# Patient Record
Sex: Female | Born: 1970 | Race: White | Hispanic: No | Marital: Married | Smoking: Never smoker
Health system: Southern US, Community
[De-identification: ages and names within clinical notes are randomized; demographics above are authoritative.]

## PROBLEM LIST (undated history)

## (undated) DIAGNOSIS — N809 Endometriosis, unspecified: Secondary | ICD-10-CM

## (undated) HISTORY — PX: AUGMENTATION MAMMAPLASTY: SUR837

## (undated) HISTORY — PX: CARPAL TUNNEL RELEASE: SHX101

## (undated) HISTORY — PX: DIAGNOSTIC LAPAROSCOPY: SUR761

## (undated) HISTORY — DX: Endometriosis, unspecified: N80.9

## (undated) HISTORY — PX: PLACEMENT OF BREAST IMPLANTS: SHX6334

---

## 1998-09-02 ENCOUNTER — Other Ambulatory Visit: Admission: RE | Admit: 1998-09-02 | Discharge: 1998-09-02 | Payer: Self-pay | Admitting: Obstetrics and Gynecology

## 1999-06-23 ENCOUNTER — Other Ambulatory Visit: Admission: RE | Admit: 1999-06-23 | Discharge: 1999-06-23 | Payer: Self-pay | Admitting: Obstetrics and Gynecology

## 2000-02-29 ENCOUNTER — Inpatient Hospital Stay (HOSPITAL_COMMUNITY): Admission: AD | Admit: 2000-02-29 | Discharge: 2000-03-02 | Payer: Self-pay | Admitting: Obstetrics and Gynecology

## 2000-04-05 ENCOUNTER — Other Ambulatory Visit: Admission: RE | Admit: 2000-04-05 | Discharge: 2000-04-05 | Payer: Self-pay | Admitting: Obstetrics and Gynecology

## 2001-01-03 ENCOUNTER — Other Ambulatory Visit: Admission: RE | Admit: 2001-01-03 | Discharge: 2001-01-03 | Payer: Self-pay | Admitting: Obstetrics and Gynecology

## 2002-11-07 ENCOUNTER — Other Ambulatory Visit: Admission: RE | Admit: 2002-11-07 | Discharge: 2002-11-07 | Payer: Self-pay | Admitting: Obstetrics and Gynecology

## 2003-11-01 ENCOUNTER — Encounter: Admission: RE | Admit: 2003-11-01 | Discharge: 2003-11-01 | Payer: Self-pay | Admitting: Internal Medicine

## 2004-01-03 ENCOUNTER — Ambulatory Visit (HOSPITAL_COMMUNITY): Admission: RE | Admit: 2004-01-03 | Discharge: 2004-01-03 | Payer: Self-pay | Admitting: Obstetrics and Gynecology

## 2004-05-27 ENCOUNTER — Other Ambulatory Visit: Admission: RE | Admit: 2004-05-27 | Discharge: 2004-05-27 | Payer: Self-pay | Admitting: Obstetrics and Gynecology

## 2005-01-26 ENCOUNTER — Ambulatory Visit: Payer: Self-pay | Admitting: Internal Medicine

## 2007-05-28 ENCOUNTER — Ambulatory Visit: Payer: Self-pay | Admitting: Internal Medicine

## 2011-01-02 ENCOUNTER — Emergency Department: Payer: Self-pay | Admitting: *Deleted

## 2011-06-05 ENCOUNTER — Ambulatory Visit: Payer: Self-pay

## 2012-04-13 ENCOUNTER — Ambulatory Visit: Payer: Self-pay | Admitting: Nurse Practitioner

## 2013-06-19 ENCOUNTER — Telehealth: Payer: Self-pay | Admitting: Internal Medicine

## 2013-06-19 NOTE — Telephone Encounter (Signed)
Patient states she saw her GYN for severe rectal pain only during her period.  GYN took her off BCP. She has had a vaginal [place] and "hormone test" both were normal.She states she has been diagnosed with endometriosis in the past. She also has a family history of colon cancer. Scheduled with Dr. Juanda Chance on 06/23/13 at 3:00 PM.

## 2013-06-23 ENCOUNTER — Ambulatory Visit (INDEPENDENT_AMBULATORY_CARE_PROVIDER_SITE_OTHER): Payer: Managed Care, Other (non HMO) | Admitting: Internal Medicine

## 2013-06-23 ENCOUNTER — Encounter: Payer: Self-pay | Admitting: *Deleted

## 2013-06-23 VITALS — BP 112/70 | HR 80 | Ht 60.0 in | Wt 121.4 lb

## 2013-06-23 DIAGNOSIS — Z8742 Personal history of other diseases of the female genital tract: Secondary | ICD-10-CM

## 2013-06-23 DIAGNOSIS — K6289 Other specified diseases of anus and rectum: Secondary | ICD-10-CM

## 2013-06-23 NOTE — Progress Notes (Signed)
Cynthia Lowe 12/27/1970 [id number]  Note: This dictation was prepared with Dragon digital system. Any transcriptional errors that result from this procedure are unintentional.   History of Present Illness:  This is a 43 year old white female who had an episode of severe rectal pressure and pain. Several weeks ago, she was evaluated in the emergency room. Secondly, she developed pain when sitting. She is currently asymptomatic. There is a history of severe endometriosis necessitating 2 laparoscopic ablations by Dr. Arelia Sneddon in 2005 and 2006. She, at that time, was having similar rectal pressure like at present  But only during her periods. She was offered a total abdominal hysterectomy and bilateral salpingo-oophorectomy but decided not to pursue it. Instead, she was placed on birth control pills which were very effective for several years   until October 2014 when she started to develop recurrent rectal pressure and pain. She denies abdominal pain. Her bowel habits are regular. There are no obstructive symptoms. She is having some night sweats and headaches since the birth control pills were stopped about a month ago and Mirena 20 mg IUD was placed by Dr Arelia Sneddon. She is anticipating her menstrual cycle to start any day and she fears that the pain will come back. Patient had a colonoscopy in August 2005 to look for endometriosis in the rectum after an abnormal CT scan showed findings which indicated recurrent endometriosis in rectal tissue as well as around the ureters. The colonoscopy was entirely normal.    Past Medical History  Diagnosis Date  . Endometriosis     Past Surgical History  Procedure Laterality Date  . Diagnostic laparoscopy      Allergies  Allergen Reactions  . Norco [Hydrocodone-Acetaminophen]     Family history and social history have been reviewed.  Review of Systems:   The remainder of the 10 point ROS is negative except as outlined in the H&P  Physical  Exam: General Appearance Well developed, in no distress Eyes  Non icteric  HEENT  Non traumatic, normocephalic  Mouth No lesion, tongue papillated, no cheilosis Neck Supple without adenopathy, thyroid not enlarged, no carotid bruits, no JVD Lungs Clear to auscultation bilaterally COR Normal S1, normal S2, regular rhythm, no murmur, quiet precordium Abdomen soft nontender with normoactive bowel sounds. Liver edge at costal margin. There is minimal discomfort on deep impression left lower quadrant. Suprapubic area is normal at lower quadrant is unremarkable Rectal and anoscopic exam reveals normal rectal sphincter tone. Normal perianal area. Normal rectal ampulla with no nodularity, edema or inflammatory changes. There are very small internal hemorrhoids which don't appear to be inflamed or painful. Stool is formed Hemoccult-negative Extremities  No pedal edema Skin No lesions Neurological Alert and oriented x 3 Psychological Normal mood and affect  Assessment and Plan:   Problem #1 known endometriosis fully evaluated and treated on 09/03/2004 by Dr Arelia Sneddon. She is now starting to have a recurrence of similar symptoms. A prior CT scan of the pelvis in 2005 suggested irregularity of the anterior rectal wall as well as indistinctness of the parametrial soft tissue. I believe we are dealing with recurrence of the disease around the rectal area causing spasm. She is not having any obstructive symptoms at this point. We will proceed with a repeat CT scan of the pelvis with intravenous and oral contrast. If there is suggestion of any constriction of the rectosigmoid, she may need a colonoscopy to further identify the extent of the disease. She will be following up with Dr. Arelia Sneddon after  our evaluation.  Problem #2 Small internal hemorrhoids. These are currently not symptomatic.    Lina SarDora Alyssa Rotondo 06/23/2013

## 2013-06-23 NOTE — Patient Instructions (Signed)
You have been scheduled for a CT scan of the abdomen and pelvis at [facility] ([address]---this is in the same building as Press [facility]).   You are scheduled on 06/27/13 at 2:30 pm. You should arrive 15 minutes prior to your appointment time for registration. Please follow the written instructions below on the day of your exam:  WARNING: IF YOU ARE ALLERGIC TO IODINE/X-RAY DYE, PLEASE NOTIFY RADIOLOGY IMMEDIATELY AT [phone]! YOU WILL BE GIVEN A 13 HOUR PREMEDICATION PREP.  1) Do not eat or drink anything after 10:30 am (4 hours prior to your test) 2) You have been given 2 bottles of oral contrast to drink. The solution may taste better if refrigerated, but do NOT add ice or any other liquid to this solution. Shake well before drinking.    Drink 1 bottle of contrast @ 12:30 pm (2 hours prior to your exam)  Drink 1 bottle of contrast @ 1:30 pm (1 hour prior to your exam)  You may take any medications as prescribed with a small amount of water except for the following: Metformin, Glucophage, Glucovance, Avandamet, Riomet, Fortamet, Actoplus Met, Janumet, Glumetza or Metaglip. The above medications must be held the day of the exam AND 48 hours after the exam.  The purpose of you drinking the oral contrast is to aid in the visualization of your intestinal tract. The contrast solution may cause some diarrhea. Before your exam is started, you will be given a small amount of fluid to drink. Depending on your individual set of symptoms, you may also receive an intravenous injection of x-ray contrast/dye. Plan on being at [facility] for 30 minutes or long, depending on the type of exam you are having performed.  This test typically takes 30-45 minutes to complete.  If you have any questions regarding your exam or if you need to reschedule, you may call the CT department at [phone] between the hours of 8:00 am and 5:00 pm,  Monday-Friday.  ________________________________________________________________________  CC:Dr Mccomb

## 2013-06-27 ENCOUNTER — Ambulatory Visit (INDEPENDENT_AMBULATORY_CARE_PROVIDER_SITE_OTHER)
Admission: RE | Admit: 2013-06-27 | Discharge: 2013-06-27 | Disposition: A | Discharge status: Home / Self Care | Payer: Managed Care, Other (non HMO) | Source: Ambulatory Visit | Attending: Internal Medicine | Admitting: Internal Medicine

## 2013-06-27 DIAGNOSIS — K6289 Other specified diseases of anus and rectum: Secondary | ICD-10-CM

## 2013-06-27 DIAGNOSIS — Z8742 Personal history of other diseases of the female genital tract: Secondary | ICD-10-CM

## 2013-06-27 MED ORDER — IOHEXOL 300 MG/ML  SOLN
100.0000 mL | Freq: Once | INTRAMUSCULAR | Status: AC | PRN
  Administered 2013-06-27: 100 mL via INTRAVENOUS

## 2013-06-28 ENCOUNTER — Other Ambulatory Visit: Payer: Self-pay | Admitting: *Deleted

## 2013-06-28 DIAGNOSIS — K6289 Other specified diseases of anus and rectum: Secondary | ICD-10-CM

## 2013-06-29 ENCOUNTER — Encounter: Payer: Self-pay | Admitting: Internal Medicine

## 2013-07-10 ENCOUNTER — Ambulatory Visit (AMBULATORY_SURGERY_CENTER): Payer: Managed Care, Other (non HMO) | Admitting: *Deleted

## 2013-07-10 VITALS — Ht 60.0 in | Wt 121.0 lb

## 2013-07-10 DIAGNOSIS — K6289 Other specified diseases of anus and rectum: Secondary | ICD-10-CM

## 2013-07-10 MED ORDER — MOVIPREP 100 G PO SOLR: ORAL | Status: DC

## 2013-07-10 NOTE — Progress Notes (Signed)
Patient denies any allergies to eggs or soy. Patient has nausea after anesthesia.  

## 2013-07-21 ENCOUNTER — Encounter: Payer: Self-pay | Admitting: Internal Medicine

## 2013-07-25 ENCOUNTER — Ambulatory Visit (AMBULATORY_SURGERY_CENTER): Payer: Managed Care, Other (non HMO) | Admitting: Internal Medicine

## 2013-07-25 ENCOUNTER — Encounter: Payer: Self-pay | Admitting: Internal Medicine

## 2013-07-25 VITALS — BP 114/70 | HR 80 | Temp 99.0°F | Resp 16 | Ht 60.0 in | Wt 121.0 lb

## 2013-07-25 DIAGNOSIS — N803 Endometriosis of pelvic peritoneum: Secondary | ICD-10-CM

## 2013-07-25 DIAGNOSIS — K6289 Other specified diseases of anus and rectum: Secondary | ICD-10-CM

## 2013-07-25 DIAGNOSIS — IMO0002 Reserved for concepts with insufficient information to code with codable children: Secondary | ICD-10-CM

## 2013-07-25 MED ORDER — HYOSCYAMINE SULFATE 0.125 MG SL SUBL: 0.1250 mg | SUBLINGUAL_TABLET | SUBLINGUAL | Status: DC | PRN

## 2013-07-25 MED ORDER — HYOSCYAMINE SULFATE 0.125 MG SL SUBL: 0.1250 mg | SUBLINGUAL_TABLET | SUBLINGUAL | Status: AC | PRN

## 2013-07-25 MED ORDER — SODIUM CHLORIDE 0.9 % IV SOLN: 500.0000 mL | INTRAVENOUS | Status: DC

## 2013-07-25 NOTE — Op Note (Addendum)
 [facility] [address] [place], [address]   COLONOSCOPY PROCEDURE REPORT  PATIENT: Cynthia Lowe, Cynthia Lowe  MR#: [id number] BIRTHDATE: 1970/08/09 , 43  yrs. old GENDER: Female ENDOSCOPIST: Hart Carwin, MD REFERRED IF:OYDX Arelia Sneddon, M.D. PROCEDURE DATE:  07/25/2013 PROCEDURE:   Colonoscopy, diagnostic First Screening Colonoscopy - Avg.  risk and is 50 yrs.  old or older - No.  Prior Negative Screening - Now for repeat screening. N/A  History of Adenoma - Now for follow-up colonoscopy & has been > or = to 3 yrs.  N/A  Polyps Removed Today? No.  Recommend repeat exam, <10 yrs? No. ASA CLASS:   Class I INDICATIONS:episodes of severe rectal pain he evaluated in the emergency room.  History of extensive endometriosis controlled on birth control pills.  Recurrence of rectal pain.  Followup CT scan of the pelvis that does not show any evidence ofperirectal thickening, which was present on prior CT scan in 2005.  Prior colonoscopy in 2005 was negative.  Positive family history of colon cancer in maternal grandmother and maternal uncle. MEDICATIONS: MAC sedation, administered by CRNA and propofol (Diprivan) 350mg  IV  DESCRIPTION OF PROCEDURE:   After the risks benefits and alternatives of the procedure were thoroughly explained, informed consent was obtained.  A digital rectal exam revealed no abnormalities of the rectum.   The LB [id number]  endoscope was introduced through the anus and advanced to the cecum, which was identified by both the appendix and ileocecal valve. No adverse events experienced.   The quality of the prep was excellent, using MoviPrep  The instrument was then slowly withdrawn as the colon was fully examined.      COLON FINDINGS: A normal appearing cecum, ileocecal valve, and appendiceal orifice were identified.  The ascending, hepatic flexure, transverse, splenic flexure, descending, sigmoid colon and rectum appeared unremarkable.  No  polyps or cancers were seen. Retroflexed views revealed no abnormalities. The time to cecum=7 minutes 410 seconds.  Withdrawal time=6 minutes 02 seconds.  The scope was withdrawn and the procedure completed. COMPLICATIONS: There were no complications.  ENDOSCOPIC IMPRESSION: Normal colon no evidence of cough rectal infiltration for extrinsic compressions suggestive of endometrioma, No evidence of inflammatory process in the colon  RECOMMENDATIONS: high fiber diet Levsin sublingual 0.125 mg when necessary rectal pain or spasm recall colonoscopy in 10 years at age 24   eSigned:  Hart Carwin, MD 07/25/2013 3:58 PM   cc:   PATIENT NAME:  Cynthia Lowe MR#: [id number]

## 2013-07-25 NOTE — Patient Instructions (Signed)
YOU HAD AN ENDOSCOPIC PROCEDURE TODAY AT THE [facility]: Refer to the procedure report that was given to you for any specific questions about what was found during the examination.  If the procedure report does not answer your questions, please call your gastroenterologist to clarify.  If you requested that your care partner not be given the details of your procedure findings, then the procedure report has been included in a sealed envelope for you to review at your convenience later.  YOU SHOULD EXPECT: Some feelings of bloating in the abdomen. Passage of more gas than usual.  Walking can help get rid of the air that was put into your GI tract during the procedure and reduce the bloating. If you had a lower endoscopy (such as a colonoscopy or flexible sigmoidoscopy) you may notice spotting of blood in your stool or on the toilet paper. If you underwent a bowel prep for your procedure, then you may not have a normal bowel movement for a few days.  DIET: Your first meal following the procedure should be a light meal and then it is ok to progress to your normal diet.  A half-sandwich or bowl of soup is an example of a good first meal.  Heavy or fried foods are harder to digest and may make you feel nauseous or bloated.  Likewise meals heavy in dairy and vegetables can cause extra gas to form and this can also increase the bloating.  Drink plenty of fluids but you should avoid alcoholic beverages for 24 hours.  ACTIVITY: Your care partner should take you home directly after the procedure.  You should plan to take it easy, moving slowly for the rest of the day.  You can resume normal activity the day after the procedure however you should NOT DRIVE or use heavy machinery for 24 hours (because of the sedation medicines used during the test).    SYMPTOMS TO REPORT IMMEDIATELY: A gastroenterologist can be reached at any hour.  During normal business hours, 8:30 AM to 5:00 PM Monday through Friday,  call [phone].  After hours and on weekends, please call the GI answering service at [phone] who will take a message and have the physician on call contact you.   Following lower endoscopy (colonoscopy or flexible sigmoidoscopy):  Excessive amounts of blood in the stool  Significant tenderness or worsening of abdominal pains  Swelling of the abdomen that is new, acute  Fever of 100F or higher    FOLLOW UP: If any biopsies were taken you will be contacted by phone or by letter within the next 1-3 weeks.  Call your gastroenterologist if you have not heard about the biopsies in 3 weeks.  Our staff will call the home number listed on your records the next business day following your procedure to check on you and address any questions or concerns that you may have at that time regarding the information given to you following your procedure. This is a courtesy call and so if there is no answer at the home number and we have not heard from you through the emergency physician on call, we will assume that you have returned to your regular daily activities without incident.  SIGNATURES/CONFIDENTIALITY: You and/or your care partner have signed paperwork which will be entered into your electronic medical record.  These signatures attest to the fact that that the information above on your After Visit Summary has been reviewed and is understood.  Full responsibility of the confidentiality   of this discharge information lies with you and/or your care-partner.     

## 2013-07-26 ENCOUNTER — Telehealth: Payer: Self-pay | Admitting: *Deleted

## 2013-07-26 NOTE — Telephone Encounter (Signed)
  Follow up Call-  Call back number 07/25/2013  Post procedure Call Back phone  # [phone]  Permission to leave phone message Yes     Patient questions:  Do you have a fever, pain , or abdominal swelling? no Pain Score  0 *  Have you tolerated food without any problems? yes  Have you been able to return to your normal activities? yes  Do you have any questions about your discharge instructions: Diet   no Medications  no Follow up visit  no  Do you have questions or concerns about your Care? no  Actions: * If pain score is 4 or above: No action needed, pain <4.

## 2016-03-05 ENCOUNTER — Other Ambulatory Visit: Payer: Self-pay | Admitting: Nurse Practitioner

## 2016-03-05 DIAGNOSIS — Z1231 Encounter for screening mammogram for malignant neoplasm of breast: Secondary | ICD-10-CM

## 2017-03-08 ENCOUNTER — Other Ambulatory Visit: Payer: Self-pay | Admitting: Nurse Practitioner

## 2017-03-08 DIAGNOSIS — Z1239 Encounter for other screening for malignant neoplasm of breast: Secondary | ICD-10-CM

## 2017-04-01 ENCOUNTER — Ambulatory Visit
Admission: RE | Admit: 2017-04-01 | Discharge: 2017-04-01 | Disposition: A | Discharge status: Home / Self Care | Payer: Managed Care, Other (non HMO) | Source: Ambulatory Visit | Attending: Nurse Practitioner | Admitting: Nurse Practitioner

## 2017-04-01 ENCOUNTER — Other Ambulatory Visit: Payer: Self-pay | Admitting: Nurse Practitioner

## 2017-04-01 ENCOUNTER — Encounter: Payer: Self-pay | Admitting: Radiology

## 2017-04-01 DIAGNOSIS — Z1239 Encounter for other screening for malignant neoplasm of breast: Secondary | ICD-10-CM

## 2017-04-01 DIAGNOSIS — Z1231 Encounter for screening mammogram for malignant neoplasm of breast: Secondary | ICD-10-CM | POA: Diagnosis present

## 2017-07-02 ENCOUNTER — Other Ambulatory Visit: Payer: Self-pay | Admitting: Neurology

## 2017-07-02 DIAGNOSIS — G35 Multiple sclerosis: Secondary | ICD-10-CM

## 2017-07-13 ENCOUNTER — Ambulatory Visit
Admission: RE | Admit: 2017-07-13 | Discharge: 2017-07-13 | Disposition: A | Discharge status: Home / Self Care | Payer: Managed Care, Other (non HMO) | Source: Ambulatory Visit | Attending: Neurology | Admitting: Neurology

## 2017-07-13 DIAGNOSIS — G35 Multiple sclerosis: Secondary | ICD-10-CM | POA: Insufficient documentation

## 2017-07-13 MED ORDER — GADOBENATE DIMEGLUMINE 529 MG/ML IV SOLN
10.0000 mL | Freq: Once | INTRAVENOUS | Status: AC | PRN
  Administered 2017-07-13: 10 mL via INTRAVENOUS

## 2019-05-23 IMAGING — MR MR HEAD WO/W CM
12 of 13 series · 34 of 48 positions shown · IV contrast (multihance)
Comparison: None.

CLINICAL DATA: Numbness and tingling in the extremities, fatigue,
shortness of breath, and short-term memory loss. Symptoms for 6
months. Evaluation for multiple sclerosis.

EXAM:
MRI HEAD WITHOUT AND WITH CONTRAST
TECHNIQUE: Multiplanar, multiecho pulse sequences of the brain and surrounding
structures were obtained without and with intravenous contrast.
CONTRAST:  10mL MULTIHANCE GADOBENATE DIMEGLUMINE 529 MG/ML IV SOLN

[Series 2: T1 · sagittal · 5.0mm · 0.47mm/px · 2 of 21 slices shown]
[im 1/21]
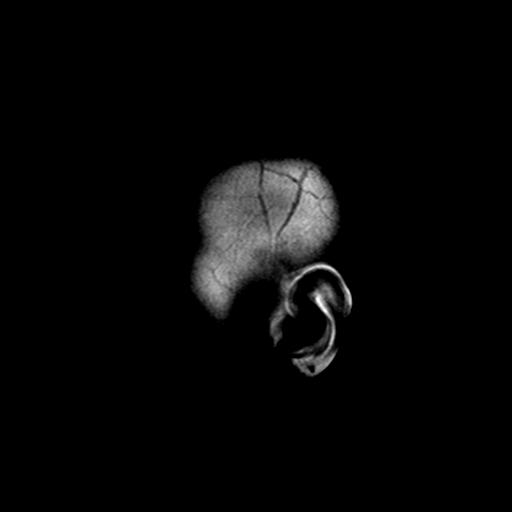
[im 21/21]
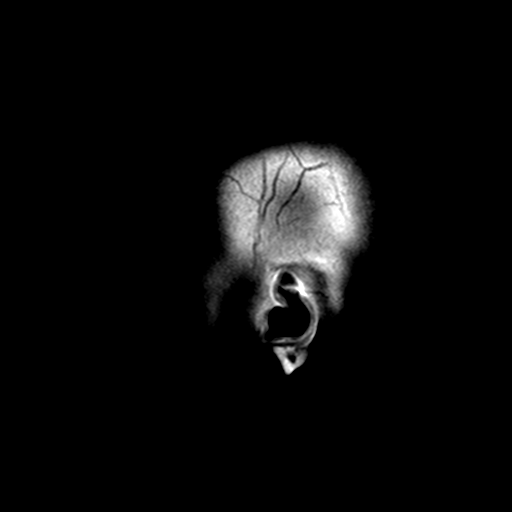

[Series 4: DWI · axial · 3.0mm · 0.94mm/px · z∈[-32,+115]mm · 3 of 50 slices shown (1 of 4)]
[im 1/50]
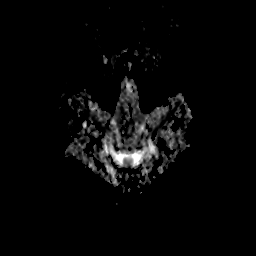
[im 25/50]
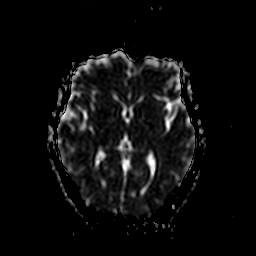
[im 50/50]
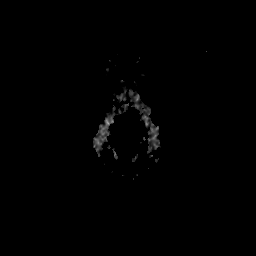

[Series 5: DWI · axial · 3.0mm · 0.94mm/px · z∈[-29,+115]mm · 3 of 49 slices shown (2 of 4)]
[im 1/49]
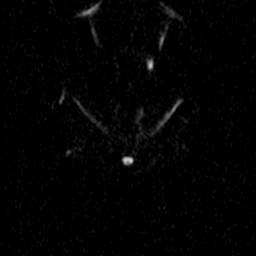
[im 25/49]
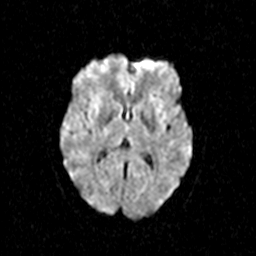
[im 49/49]
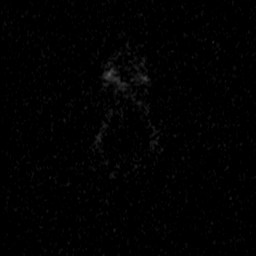

[Series 7: DWI · coronal · 5.0mm · 1.80mm/px · 3 of 39 slices shown (3 of 4)]
[im 1/39]
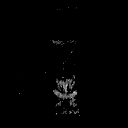
[im 20/39]
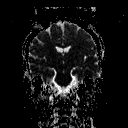
[im 39/39]
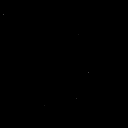

[Series 8: DWI · coronal · 5.0mm · 1.80mm/px · 3 of 38 slices shown (4 of 4)]
[im 1/38]
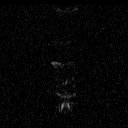
[im 19/38]
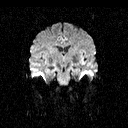
[im 38/38]
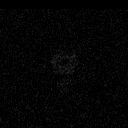

[Series 9: T2 · axial · 5.0mm · 0.45mm/px · z∈[-44,+110]mm · 2 of 23 slices shown (1 of 2)]
[im 1/23]
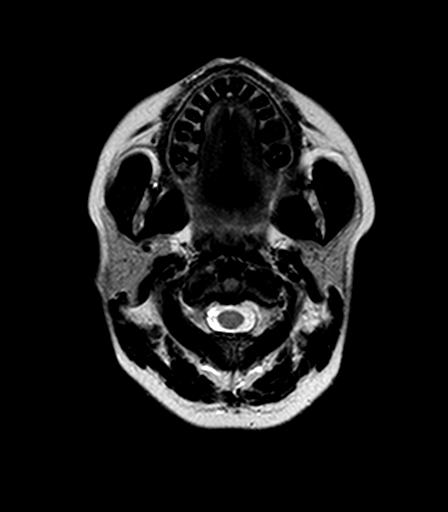
[im 23/23]
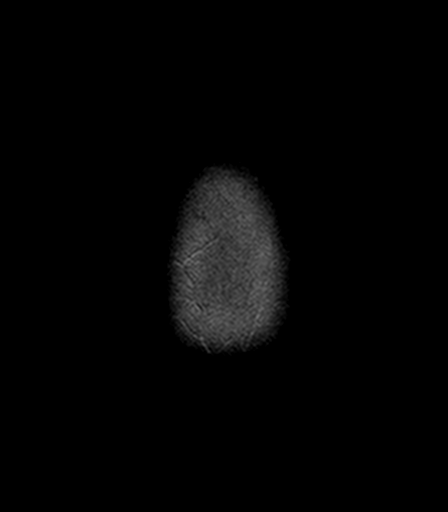

[Series 10: FLAIR · axial · 3.0mm · 0.90mm/px · z∈[-39,+108]mm · 3 of 50 slices shown (1 of 2)]
[im 1/50]
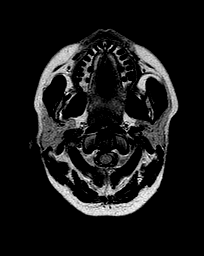
[im 25/50]
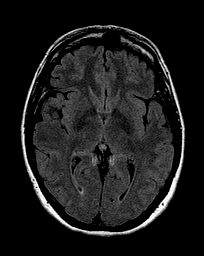
[im 50/50]
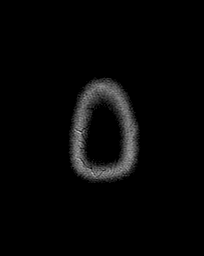

[Series 11: T2 · axial · 5.0mm · 0.45mm/px · z∈[-44,+110]mm · 2 of 23 slices shown (2 of 2)]
[im 1/23]
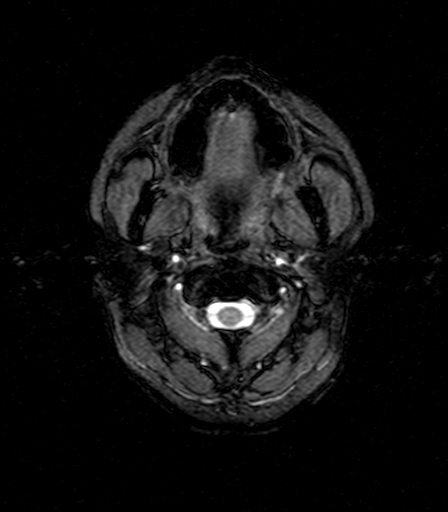
[im 23/23]
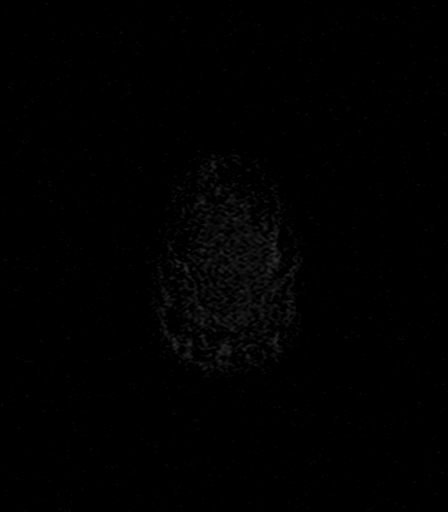

[Series 13: FLAIR · sagittal · 5.0mm · 0.90mm/px · 1 of 21 slices shown (2 of 2)]
[im 1/21]
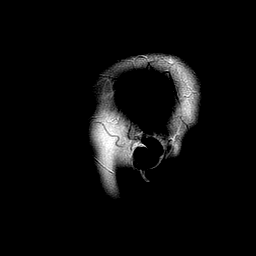

[Series 14: T2 post-contrast · coronal · 5.0mm · 0.45mm/px · 2 of 27 slices shown]
[im 1/27]
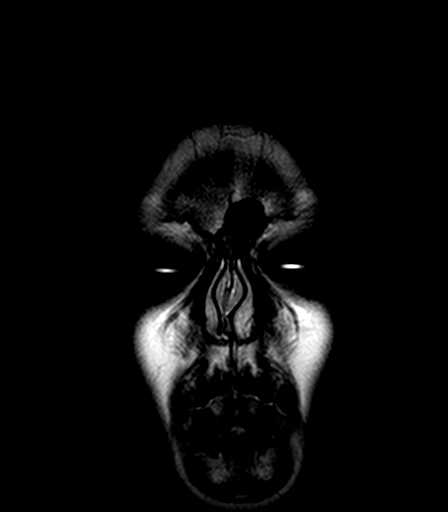
[im 27/27]
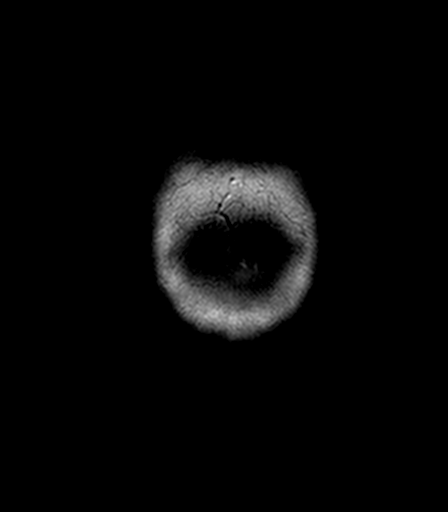

[Series 15: T1 post-contrast · axial · 1.0mm · 0.45mm/px · z∈[-46,+113]mm · 8 of 160 slices shown (1 of 2)]
[im 1/160]
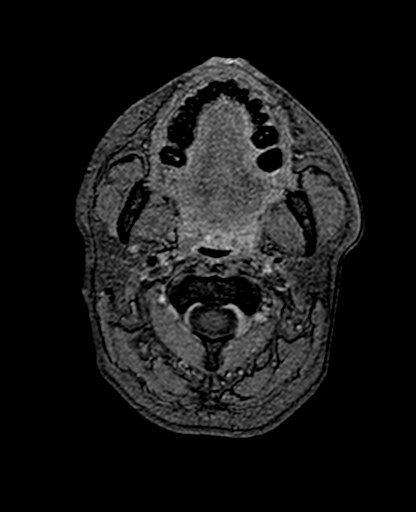
[im 32/160]
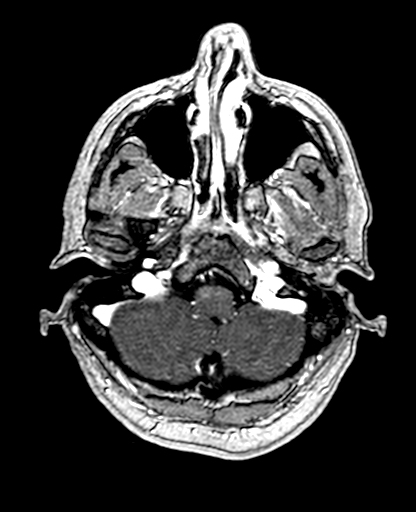
[im 48/160]
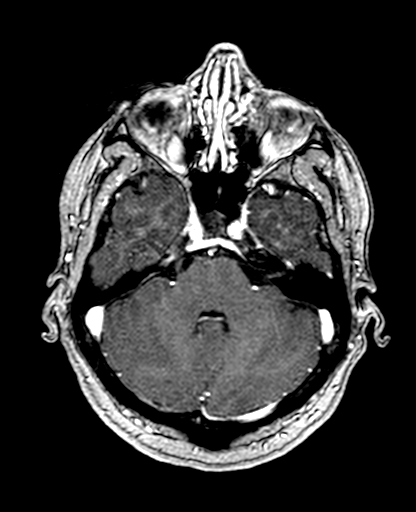
[im 64/160]
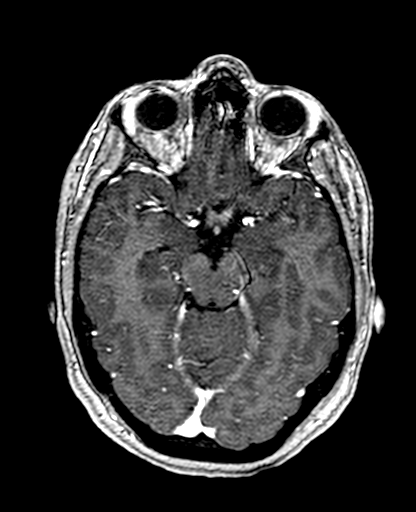
[im 96/160]
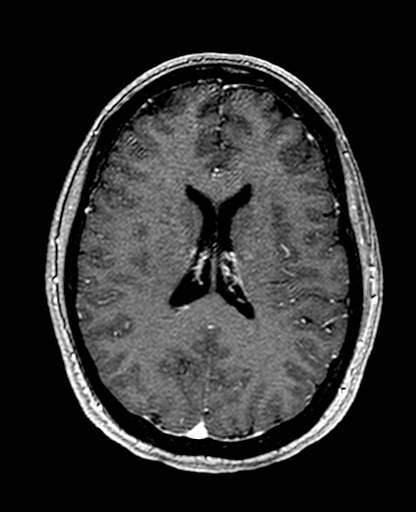
[im 112/160]
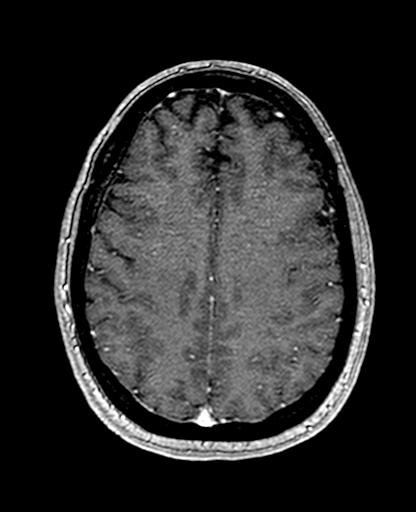
[im 128/160]
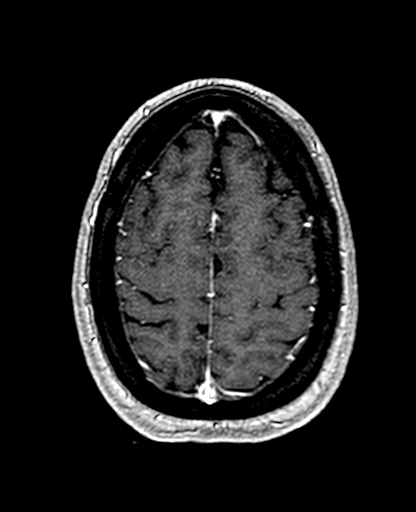
[im 160/160]
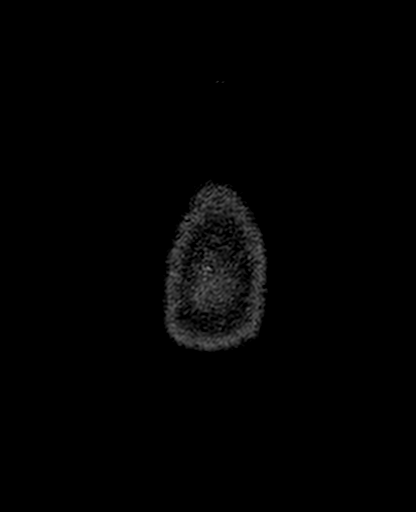

[Series 16: T1 post-contrast · coronal · 5.0mm · 0.45mm/px · 2 of 27 slices shown (2 of 2)]
[im 1/27]
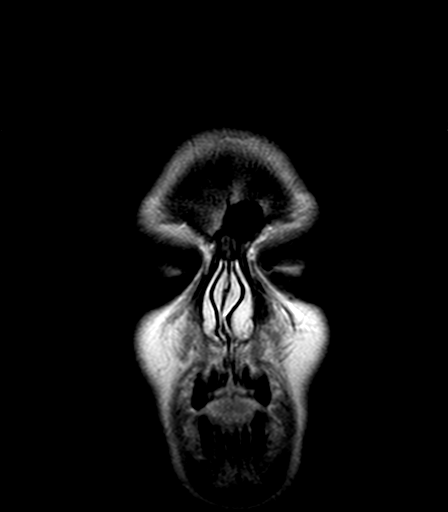
[im 27/27]
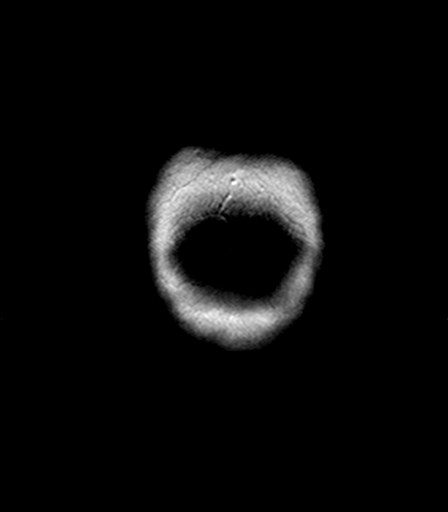

[34 of 48 positions shown; findings below may reference images not displayed]

FINDINGS: Brain: There is no evidence of acute infarct, intracranial
hemorrhage, mass, midline shift, or extra-axial fluid collection.
The ventricles and sulci are normal. There is a punctate focus of
T2/FLAIR hyperintensity in the right centrum semiovale, and there is
an additional punctate white matter focus more anteriorly in the
right frontal lobe. The corpus callosum, brainstem, and cerebellum
are unremarkable. No abnormal enhancement is identified.

Vascular: Major intracranial vascular flow voids are preserved.

Skull and upper cervical spine: Unremarkable bone marrow signal.

Sinuses/Orbits: Unremarkable orbits. Clear paranasal sinuses. Small
left mastoid effusion.

Other: None.
IMPRESSION: Two punctate foci of T2 hyperintensity in the right cerebral
hemispheric white matter, nonspecific and not greater than is often
seen in healthy patients of this age. No specific findings of
demyelinating disease.
